# Patient Record
Sex: Male | Born: 1960 | Hispanic: No | Smoking: Current every day smoker
Health system: Southern US, Community
[De-identification: ages and names within clinical notes are randomized; demographics above are authoritative.]

## PROBLEM LIST (undated history)

## (undated) DIAGNOSIS — K219 Gastro-esophageal reflux disease without esophagitis: Secondary | ICD-10-CM

---

## 2005-01-14 ENCOUNTER — Inpatient Hospital Stay (HOSPITAL_COMMUNITY): Admission: AD | Admit: 2005-01-14 | Discharge: 2005-01-20 | Payer: Self-pay | Admitting: Psychiatry

## 2005-01-15 ENCOUNTER — Ambulatory Visit: Payer: Self-pay | Admitting: Psychiatry

## 2007-03-06 ENCOUNTER — Ambulatory Visit: Payer: Self-pay | Admitting: Psychiatry

## 2007-03-06 ENCOUNTER — Emergency Department (HOSPITAL_COMMUNITY): Admission: EM | Admit: 2007-03-06 | Discharge: 2007-03-06 | Payer: Self-pay | Admitting: Emergency Medicine

## 2007-03-07 ENCOUNTER — Inpatient Hospital Stay (HOSPITAL_COMMUNITY): Admission: RE | Admit: 2007-03-07 | Discharge: 2007-03-10 | Payer: Self-pay | Admitting: Psychiatry

## 2010-06-14 NOTE — H&P (Signed)
NAME:  COUSINSSasan, Wilkie NO.:  [id number]   MEDICAL RECORD NO.:  [id number]          PATIENT TYPE:  IPS   LOCATION:  0503                          FACILITY:  [facility]   PHYSICIAN:  Geoffery Lyons, M.D.      DATE OF BIRTH:  07-01-1960   DATE OF ADMISSION:  03/06/2007  DATE OF DISCHARGE:  03/06/2007                       PSYCHIATRIC ADMISSION ASSESSMENT   DATE OF ASSESSMENT:  March 07, 2007 and 8:20 a.m.   IDENTIFICATION:  This is a 50 year old African American male.  This is a  voluntary admission.   HISTORY OF PRESENT ILLNESS:  This 50 year old presented in our emergency  room requesting detox from alcohol.  He reports that he has been  drinking alcohol since childhood and currently is drinking a lot.  His  last long period of abstinence had been in 2006 after he was detoxed  here at [facility] and was able to maintain abstinence for 4-5  months.  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  The job shut down in December and the patient has  been drinking alcohol in escalating fashion since that time, now reports  drinking a lot every single day, pretty much as much as he can get his  hands on, becoming depressed and developing a bad attitude.  He  contacted his ex-wife, who is his main support person and with whom he  is a good friend, who brought him to the hospital.  He has been denying  any active suicidal thoughts.  He also endorses using large amounts of  marijuana and cocaine most every day and had used cocaine, marijuana and  alcohol on the day of admission.   PAST PSYCHIATRIC HISTORY:  He has a history of 1 prior admission to [facility],  December 16 to the January 20, 2005 and was discharged at that time on  Symmetrel 100 mg twice a day and Risperdal 0.5 mg every 12 hours and  Celexa 20 mg daily for depression.  He denies a history of prior suicide  attempts.  He had been using multiple substances since his  childhood.  He began drinking around age 14th; the longest abstinent period is 4-5  months.   SOCIAL HISTORY:  Basic education.  He is divorced and his ex-wife his  primary support person.  He has 1 daughter who is age 61, from whom he  is estranged, and he is currently homeless.  He has a history of 1 prior  DWI in 1992 and no current legal problems.   FAMILY HISTORY:  Family history is unknown; he grew up in foster care.   MEDICAL HISTORY:  No regular primary care Aldric Wenzler.   MEDICAL PROBLEMS:  Acid reflux.   MEDICATIONS:  Nexium 40 mg daily.   DRUG ALLERGIES:  No known drug allergies.   PAST MEDICAL HISTORY:  Remarkable for no prior history of seizure, no  known history of liver disease.   PHYSICAL EXAMINATION:  GENERAL:  Physical exam was done in the emergency  room.  This is a healthy-appearing gentleman who is in no acute  distress.  VITAL SIGNS:  Six-feet tall, 183 pounds.  Temperature 97.1, pulse 67,  respirations 18, blood pressure 119/64.   DIAGNOSTIC STUDIES:  Done in the emergency room.  He presented with an  alcohol level of 77.  Urine drug screen positive for cocaine and  marijuana.  CBC:  WBC 7.2, hemoglobin 13.2, hematocrit 38.8 and  platelets 277,000.  Chemistry:  Sodium 135, potassium 3.6, chloride 103,  carbon dioxide 27, BUN 12, creatinine 1.14 and random glucose 104.  Liver enzymes within normal limits, SGOT 16, SGPT 15, alkaline  phosphatase 58, total bilirubin is 0.8.  Calcium normal at 8.7.  Routine  urinalysis is negative for any acute findings.   MENTAL STATUS EXAM:  Fully alert gentleman, pleasant, cooperative, good  hygiene, bright affect, appears calm, no acute signs of withdrawal.  Speech normal in pace, tone and production and form.  Mood is neutral.  He reports that he had become quite depressed between the drinking and  not having any work to do feels; he feels that that was his main  trigger, with the lack of work and lack of direction in  his life, was  isolated in [place] with no support system and his ex-wife was  instrumental in getting him back to [place], able to identify his  relapse trigger his depression and lack of work.  Thought process is  logical and coherent.  He is oriented x4.  No delusional statements.  No  sign of delirium or confusion.  Insight is adequate.  He is formulated  goals for myself and would like to pursue [facility].   DIAGNOSES:   AXES I:  1. EtOH abuse and dependence.  2. Polysubstance abuse.   AXIS II:  Deferred.   AXIS III:  Acid reflux.   AXIS IV:  Homeless.   AXIS V:  Forty, past year not known.   PLAN:  Plan is to voluntarily admit the patient with a goal of a safe  detox within 5 days; we have started him on a Librium detox protocol and  he will receive Librium in tapering fashion along with vitamins,  thiamine 100 mg daily, MVI 1 daily and additional medications for  withdrawal symptoms.  He had previously been taking Nexium 40 mg daily,  which we will restart, and he had been prescribed Vistaril for nerves,  which we will discontinue at this point; he was not taking this anyway.  Meanwhile, we are going to get a TB skin test in anticipation of his  placement  with an [facility] and he is refusing influenza immunizations at this  time.  He is enrolled in dual diagnosis programming here and is actively  attending all groups.   ESTIMATED LENGTH OF STAY:  Five days.      Margaret A. Scott, N.P.      Geoffery Lyons, M.D.  Electronically Signed    MAS/MEDQ  D:  03/07/2007  T:  03/08/2007  Job:  [id number]

## 2010-06-17 NOTE — H&P (Signed)
NAME:  Danny Carey, Tsosie NO.:  [id number]   MEDICAL RECORD NO.:  [id number]          PATIENT TYPE:  IPS   LOCATION:  0303                          FACILITY:  [facility]   PHYSICIAN:  Geoffery Lyons, M.D.      DATE OF BIRTH:  10/18/1960   DATE OF ADMISSION:  01/14/2005  DATE OF DISCHARGE:                         PSYCHIATRIC ADMISSION ASSESSMENT   IDENTIFYING INFORMATION:  This is an involuntary admission.  This is a 50-  year-old separated African-American male.  He presented to the emergency  department at [facility] yesterday and requested help for his substance  abuse.  He says I know I have a problem and need help.  He states that  since being separated from his wife in June he has just been here and there,  especially jail.  Apparently his wife took out a 50B against him and he also  has felony charges for kidnapping and robbery going back to 1999.   PAST PSYCHIATRIC HISTORY:  He has none.   SOCIAL HISTORY:  He states he graduated high school in 86.  He has had all  kinds of jobs, textile, Aeronautical engineer, labor, Catering manager.  He has only been married  the one time to this wife, he states for 16 years.  They have no children  from the marriage.  He has other children that are grown that he is not in  contact with.   FAMILY HISTORY:  He denies.   ALCOHOL AND DRUG ABUSE:  He began using alcohol and marijuana at age 12.  He  began crack in his 30s.  He also smokes cigarettes.   PAST MEDICAL HISTORY:  Primary care Jamariya Davidoff is Dr. Juventino Slovak.  He is  reported to have acid reflux and takes Nexium.   ALLERGIES:  No known drug allergies.   POSITIVE PHYSICAL FINDINGS:  PHYSICAL EXAMINATION:  His urine drug screen  was positive for marijuana and cocaine.  His UTI showed a small amount of  leukocyte esterase, and his potassium was low at 3.  Otherwise his physical  examination is as per [place] but essentially he is a thin African-  American male in no acute distress.  His vital  signs upon admission here to  the [facility]: He weighed 164 pounds.  His temperature is  97.7, blood pressure is 120/71, pulse is 60 and respirations are 20.   MENTAL STATUS EXAM:  He is alert and oriented x3.  He is casually groomed  and dressed.  His eye contact is good.  His speech is slightly intense.  His  level of consciousness is fully alert.  His thought processes are relevant.  His judgment and insight are fair.  His concentration and memory are intact.  He denies suicidal or homicidal ideation.  He says that when he is using  drugs he has hallucinations but he cannot explain it.   ADMISSION DIAGNOSES:  AXIS I:  Polysubstance dependence.  AXIS II:  Rule out personality disorder not otherwise specified.  AXIS III:  Personal history for acid reflux treated with Nexium.  AXIS IV:  Severe, problems with primary support group,  occupational  problems, housing problems, economic problems, and problems related to legal  system.  There is a 50B out on him, taken out by his wife because he is  doing drugs and she is scared of him, and there is also a felony kidnapping  and robbery charge on the books since 1999.   PLAN:  The plan is to admit for safety and stabilization.  Toward that end  the low-dose Librium protocol was started, although he had no evidence for  alcohol when he was in the emergency room yesterday.  Due to his UTI we will  start Cipro 250 mg p.o. b.i.d. x3.  We will check him out for STDs as he has  been sexually active, and we will give him Protonix 20 mg daily for his  reflux, and we will give him K-Dur 20 mEq every morning for his low  potassium.  The case management will be working on placement for drug rehab  tomorrow.      Mickie Leonarda Salon, P.A.-C.      Geoffery Lyons, M.D.  Electronically Signed    MD/MEDQ  D:  01/15/2005  T:  01/15/2005  Job:  [id number]

## 2010-06-17 NOTE — Discharge Summary (Signed)
NAME:  Danny Carey, Danny Carey NO.:  [id number]   MEDICAL RECORD NO.:  [id number]          PATIENT TYPE:  IPS   LOCATION:  0503                          FACILITY:  [facility]   PHYSICIAN:  Geoffery Lyons, M.D.      DATE OF BIRTH:  1960-03-30   DATE OF ADMISSION:  03/07/2007  DATE OF DISCHARGE:  03/10/2007                               DISCHARGE SUMMARY   CHIEF COMPLAINT AND HISTORY OF PRESENT ILLNESS:  This was the second  admission to [facility] for this 50 year old African-  American male voluntarily admitted.  He requested detoxification from  alcohol.  Had been drinking alcohol since childhood. Last long period of  abstinence had been in 2006 after he was detoxified at [facility] was able to abstain for 4-5 months. More recently living in  [place]. He was working [employer] representative. His job shut down in  December, and he has been drinking alcohol in escalating fashion since  that time, drinking a lot every day, as much as he can get his hands on,  becoming depressed, developing a bad attitude.  Contacted his ex-wife,  his main support, and she brought him to the hospital. Also endorsed  using large amounts of marijuana, cocaine.   PAST PSYCHIATRIC HISTORY:  History of one prior admission to [facility] December 16 to January 20, 2005, secondary history had been  using multiple substances since childhood, drinking around age 26.  Longest abstinence 4-5 months.   MEDICAL HISTORY:  Noncontributory.   MEDICATIONS:  Nexium 40 mg per day.   PHYSICAL EXAMINATION:  Failed to show any acute findings.   LABORATORY WORKUP:  Alcohol level 77.  Urine drug screen positive for  cocaine and marijuana. White blood cells 7.2, hemoglobin 13.2 and  hematocrit 38.8. Sodium 135, potassium 3.6, BUN 12, creatinine 1.14,  glucose 104, SGOT 16, SGPT 15.   MENTAL STATUS EXAMINATION:  Reveals a fully alert, cooperative male,  good hygiene, bright affect, appears  to be calm, no acute signs of  withdrawal.  Speech was normal in pace, tone, and production. Had become  quite depressed between the drinking and not having any work to do. Larey Seat  that this was the main trigger, lack of work, lack of direction in his  life.  He was isolated in [place], no support. Thought processes  were logical, coherent and relevant.  No delusions.  No active suicidal  ideas, no hallucinations. Cognition well preserved.   AXIS I:  1. Alcohol dependence.  2. Cocaine and marijuana dependence.  3. Depressive disorder not otherwise specified.  AXIS II:  No diagnosis.  AXIS III:  Acid reflux.  AXIS IV:  Moderate.  AXIS V:  Upon admission 35, GAF in the last year 60.   COURSE IN THE HOSPITAL:  Was admitted, started individual and group  psychotherapy.  He was detoxified with Librium. He was given Nexium. As  already stated, a 51 year old male who was last admitted to [facility] December 2006. After he left, he went to the [facility].  He was able to abstain  for several months while he was in this  setting. He left the [facility] and got with the wrong people and  relapsed. Admitted to alcohol, cannot count how many drinks every day;  cocaine he snorts or chews every day, as well as marijuana. Admitted to  alcohol every day, cocaine, marijuana. 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  Endorsed increased  depression, increased use of substances.  In 2006, was discharged on  Symmetrel, Risperdal, trazodone and Celexa. More recently he was wanting  help.  He saw himself in the mirror and realized that he had to do  something. He continued to be detoxified. He was wanting to find an  [facility] as this place worked for him before as he knew he needed  the structure.  For gastroesophageal reflux, Nexium was the only  medication that will help him, so he was switched to Nexium.  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   He continued to work with detoxification and relapse prevention. He was  somewhat irritable.  On February 7, he endorsed homicidal ideas towards  anyone who might aggravate him all talk or talk about his family. Some  symptoms of withdrawal.  On February 8, he was observed standing outside  women's room doors, accused by male patient of making inappropriate  comments seeking phone numbers and groping them.  The patient denied. He  needed constant redirection, and he needed to be reminded of the  boundaries.  February 8, he was ready to be discharged. He endorsed that  he had a bad night that night, but he was ready to go on to halfway  house. He was insightful in that being off drugs and alcohol was causing  some of the anger he had. As he was stable enough with no suicidal or  homicidal ideas, we went ahead and discharged to where he was going to  go, to the [facility].   DISCHARGE DIAGNOSES:  AXIS I:  1. Alcohol, cocaine, marijuana dependence.  2. Mood disorder not otherwise specified.  AXIS II:  No diagnosis.  AXIS III:  Gastroesophageal reflux.  AXIS IV:  Moderate.  AXIS V:  Upon discharge 50.   Discharged on no psychotropics, to resume his own Nexium 40 mg per day.   Follow up at [facility].      Geoffery Lyons, M.D.  Electronically Signed     IL/MEDQ  D:  04/05/2007  T:  04/07/2007  Job:  [id number]

## 2010-06-17 NOTE — Discharge Summary (Signed)
NAME:  Danny Carey, Danny Carey NO.:  [id number]   MEDICAL RECORD NO.:  [id number]          PATIENT TYPE:  IPS   LOCATION:  0303                          FACILITY:  [facility]   PHYSICIAN:  Jeanice Lim, M.D. DATE OF BIRTH:  28-Jan-1961   DATE OF ADMISSION:  01/14/2005  DATE OF DISCHARGE:  01/20/2005                                 DISCHARGE SUMMARY   IDENTIFYING DATA:  This is a 50 year old African-American male who presented  to the emergency room at [facility], requested help with his substance abuse  problem.  Felt that he was getting out of control and unable to go on like  this.   PAST PSYCHIATRIC HISTORY:  No prior treatment.  History of alcohol and  cannabis use.   PAST MEDICAL HISTORY:  Acid reflux.   MEDICATIONS:  Nexium.   ALLERGIES:  No known drug allergies.   PHYSICAL EXAMINATION:  Neurologic exam essentially within normal limits.   ROUTINE ADMISSION LABORATORY:  Salicylate within normal limits, low  potassium and possible leukocyte esterase.  TSH within normal limits.  RPR  nonreactive.  HIV nonreactive.  Valproic acid level less than 1.  Chlamydia  none, GC probe negative.   MENTAL STATUS EXAM:  The patient was alert and oriented.  No psychomotor  abnormalities.  Tremulous, anxious, motivated to get sober, dysphoric and  irritable.  No acute homicidal or suicidal ideation or psychotic symptoms.  Cognition was intact.  Judgment and insight were impaired.   ADMITTING DIAGNOSES:  Axis I.  Polysubstance abuse, alcohol and marijuana  dependency.  Axis II.  Personality features.  Axis III.  Acid reflux history.  Axis IV.  Severe stressors with limited support system.  Problems related to  legal system.  Axis V.  37/55 to 60   HOSPITAL COURSE:  The patient was admitted, ordered routine p.r.n.  medications, underwent further monitoring and was encouraged to participate  in individual and group milieu therapy.  The patient was monitored for  safety and  discussed longer term monitoring treatment program.  Appeared  motivated and reported some depressive symptoms and reported that he could  not leave the hospital because he felt he might step into traffic or  overdose unless he knew he was going to be able to get his life together and  he was fearful of the severity of his addiction which showed some insight.  The patient was stable after detox and was discharged in improved condition  with an appropriate aftercare plan in place.  He was given medication  education and discharged on Symmetrel 100 mg twice a day, Protonix 40 mg in  the morning, Risperdal 0.5 mg q.12, Trazodone 50 mg 1-1/2 at bedtime, Celexa  20 mg in the morning.  The patient was discharged to follow up at the [facility].   DISCHARGE DIAGNOSES:  Axis I.  Polysubstance abuse, alcohol and marijuana  dependency.  Axis II.  Personality features.  Axis III.  Acid reflux history.  Axis IV.  Severe stressors with limited support system.  Problems related to  legal system.  Axis V.  55  Jeanice Lim, M.D.  Electronically Signed     JEM/MEDQ  D:  01/26/2005  T:  01/26/2005  Job:  [id number]

## 2010-10-21 LAB — URINALYSIS, ROUTINE W REFLEX MICROSCOPIC
Bilirubin Urine: NEGATIVE
Glucose, UA: NEGATIVE
Hgb urine dipstick: NEGATIVE
Ketones, ur: NEGATIVE
Nitrite: NEGATIVE
Specific Gravity, Urine: 1.015
pH: 5.5

## 2010-10-21 LAB — DRUGS OF ABUSE SCREEN W/O ALC, ROUTINE URINE
Barbiturate Quant, Ur: NEGATIVE
Benzodiazepines.: POSITIVE — AB
Cocaine Metabolites: POSITIVE — AB
Marijuana Metabolite: POSITIVE — AB
Propoxyphene: NEGATIVE

## 2010-10-21 LAB — DIFFERENTIAL
Basophils Relative: 2 — ABNORMAL HIGH
Eosinophils Absolute: 0.3
Lymphs Abs: 2.5
Monocytes Relative: 6
Neutro Abs: 5.1
Neutrophils Relative %: 60

## 2010-10-21 LAB — CBC
HCT: 38.8 — ABNORMAL LOW
Hemoglobin: 13.2
MCHC: 34.4
Platelets: 293
RBC: 3.95 — ABNORMAL LOW
RBC: 3.95 — ABNORMAL LOW
WBC: 7.2
WBC: 8.5

## 2010-10-21 LAB — BASIC METABOLIC PANEL
BUN: 10
Calcium: 9.1
Creatinine, Ser: 0.93
GFR calc Af Amer: >60

## 2010-10-21 LAB — COMPREHENSIVE METABOLIC PANEL
ALT: 15
Alkaline Phosphatase: 58
BUN: 12
CO2: 27
Chloride: 103
Glucose, Bld: 104 — ABNORMAL HIGH
Potassium: 3.6
Sodium: 135
Total Bilirubin: 0.8

## 2010-10-21 LAB — RAPID URINE DRUG SCREEN, HOSP PERFORMED
Barbiturates: NOT DETECTED
Cocaine: POSITIVE — AB
Opiates: NOT DETECTED
Tetrahydrocannabinol: POSITIVE — AB

## 2010-10-21 LAB — THC (MARIJUANA), URINE, CONFIRMATION: Marijuana, Ur-Confirmation: 32 ng/mL

## 2010-10-21 LAB — COCAINE, URINE, CONFIRMATION: Benzoylecgonine GC/MS Conf: 550 ng/mL

## 2010-10-21 LAB — ETHANOL: Alcohol, Ethyl (B): 77 — ABNORMAL HIGH

## 2010-10-21 LAB — BENZODIAZEPINE, QUANTITATIVE, URINE: Alprazolam (GC/LC/MS), ur confirm: NEGATIVE

## 2015-12-08 ENCOUNTER — Emergency Department (HOSPITAL_COMMUNITY)
Admission: EM | Admit: 2015-12-08 | Discharge: 2015-12-08 | Disposition: A | Discharge status: Home / Self Care | Payer: BLUE CROSS/BLUE SHIELD | Attending: Emergency Medicine | Admitting: Emergency Medicine

## 2015-12-08 ENCOUNTER — Emergency Department (HOSPITAL_COMMUNITY): Payer: BLUE CROSS/BLUE SHIELD

## 2015-12-08 ENCOUNTER — Encounter (HOSPITAL_COMMUNITY): Payer: Self-pay | Admitting: Emergency Medicine

## 2015-12-08 DIAGNOSIS — M549 Dorsalgia, unspecified: Secondary | ICD-10-CM | POA: Diagnosis not present

## 2015-12-08 DIAGNOSIS — S199XXA Unspecified injury of neck, initial encounter: Secondary | ICD-10-CM | POA: Insufficient documentation

## 2015-12-08 DIAGNOSIS — F17200 Nicotine dependence, unspecified, uncomplicated: Secondary | ICD-10-CM | POA: Diagnosis not present

## 2015-12-08 DIAGNOSIS — Y92410 Unspecified street and highway as the place of occurrence of the external cause: Secondary | ICD-10-CM | POA: Diagnosis not present

## 2015-12-08 DIAGNOSIS — Y999 Unspecified external cause status: Secondary | ICD-10-CM | POA: Insufficient documentation

## 2015-12-08 DIAGNOSIS — Y939 Activity, unspecified: Secondary | ICD-10-CM | POA: Diagnosis not present
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
## 2015-12-08 MED ORDER — ASPIRIN EC 81 MG PO TBEC
162.0000 mg | DELAYED_RELEASE_TABLET | Freq: Once | ORAL | Status: DC
  Filled 2015-12-08: qty 2

## 2015-12-08 MED ORDER — ACETAMINOPHEN 325 MG PO TABS
650.0000 mg | ORAL_TABLET | Freq: Once | ORAL | Status: AC
  Administered 2015-12-08: 650 mg via ORAL
  Filled 2015-12-08: qty 2
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
## 2015-12-08 NOTE — Discharge Instructions (Signed)
Please read attached information. If you experience any new or worsening signs or symptoms please return to the emergency room for evaluation. Please follow-up with your primary care provider or specialist as discussed.  °

## 2015-12-08 NOTE — ED Notes (Signed)
Did not scan tylenol as ordered and pt did not sign due to pt being in a rush to leave.

## 2015-12-09 ENCOUNTER — Emergency Department (HOSPITAL_COMMUNITY)
Admission: EM | Admit: 2015-12-09 | Discharge: 2015-12-09 | Disposition: A | Discharge status: Home / Self Care | Payer: BLUE CROSS/BLUE SHIELD | Attending: Emergency Medicine | Admitting: Emergency Medicine

## 2015-12-09 ENCOUNTER — Encounter (HOSPITAL_COMMUNITY): Payer: Self-pay | Admitting: Emergency Medicine

## 2015-12-09 DIAGNOSIS — F17200 Nicotine dependence, unspecified, uncomplicated: Secondary | ICD-10-CM | POA: Diagnosis not present

## 2015-12-09 DIAGNOSIS — M542 Cervicalgia: Secondary | ICD-10-CM | POA: Diagnosis present

## 2015-12-09 DIAGNOSIS — R51 Headache: Secondary | ICD-10-CM | POA: Diagnosis not present

## 2015-12-09 DIAGNOSIS — M549 Dorsalgia, unspecified: Secondary | ICD-10-CM | POA: Insufficient documentation

## 2015-12-09 MED ORDER — ACETAMINOPHEN 325 MG PO TABS: 650.0000 mg | ORAL_TABLET | Freq: Four times a day (QID) | ORAL | 0 refills | Status: DC | PRN

## 2015-12-09 MED ORDER — METHOCARBAMOL 500 MG PO TABS: 500.0000 mg | ORAL_TABLET | Freq: Two times a day (BID) | ORAL | 0 refills | Status: DC

## 2015-12-09 MED ORDER — IBUPROFEN 400 MG PO TABS: 400.0000 mg | ORAL_TABLET | Freq: Four times a day (QID) | ORAL | 0 refills | Status: DC | PRN

## 2015-12-09 NOTE — Discharge Instructions (Signed)
You likely will be sore for the next 2-3 days. Please take the Robaxin before bed or while relaxing at home (do not drive or operate any heavy machinery while taking it) for relief of your muscle tightness. Also, take ibuprofen and Tylenol every 6 hours as needed for pain.
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
## 2015-12-09 NOTE — ED Notes (Signed)
Bed: [facility] Expected date:  Expected time:  Means of arrival:  Comments: 

## 2015-12-12 ENCOUNTER — Encounter (HOSPITAL_COMMUNITY): Payer: Self-pay | Admitting: Emergency Medicine

## 2015-12-12 ENCOUNTER — Emergency Department (HOSPITAL_COMMUNITY)
Admission: EM | Admit: 2015-12-12 | Discharge: 2015-12-12 | Disposition: A | Discharge status: Home / Self Care | Payer: BLUE CROSS/BLUE SHIELD | Attending: Emergency Medicine | Admitting: Emergency Medicine

## 2015-12-12 DIAGNOSIS — S39012D Strain of muscle, fascia and tendon of lower back, subsequent encounter: Secondary | ICD-10-CM | POA: Insufficient documentation

## 2015-12-12 DIAGNOSIS — S39012A Strain of muscle, fascia and tendon of lower back, initial encounter: Secondary | ICD-10-CM

## 2015-12-12 DIAGNOSIS — F17210 Nicotine dependence, cigarettes, uncomplicated: Secondary | ICD-10-CM | POA: Diagnosis not present

## 2015-12-12 HISTORY — DX: Gastro-esophageal reflux disease without esophagitis: K21.9

## 2015-12-12 MED ORDER — NAPROXEN 500 MG PO TABS: 500.0000 mg | ORAL_TABLET | Freq: Two times a day (BID) | ORAL | 0 refills | Status: DC

## 2015-12-12 NOTE — Discharge Instructions (Signed)
Stop taking ibuprofen. Start taking Naprosyn twice daily. Continue taking Robaxin. You may take 1000 mg of Tylenol every 6 hours as well. Go to your scheduled orthopedic appointment tomorrow. Return to the emergency department for numbness, weakness, urinary retention, inability to walk, or any new or concerning symptoms.
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
## 2015-12-12 NOTE — ED Notes (Signed)
Bed: [facility] Expected date: 12/11/15 Expected time: 11:41 PM Means of arrival:  Comments:

## 2015-12-12 NOTE — ED Notes (Signed)
PA at bedside.

## 2015-12-31 ENCOUNTER — Other Ambulatory Visit (HOSPITAL_COMMUNITY): Payer: Self-pay | Admitting: Orthopedic Surgery

## 2015-12-31 DIAGNOSIS — S335XXD Sprain of ligaments of lumbar spine, subsequent encounter: Secondary | ICD-10-CM

## 2016-01-05 ENCOUNTER — Ambulatory Visit (HOSPITAL_COMMUNITY): Admission: RE | Admit: 2016-01-05 | Payer: No Typology Code available for payment source | Source: Ambulatory Visit

## 2017-04-25 ENCOUNTER — Ambulatory Visit (HOSPITAL_COMMUNITY)
Admission: EM | Admit: 2017-04-25 | Discharge: 2017-04-25 | Disposition: A | Discharge status: Home / Self Care | Payer: BLUE CROSS/BLUE SHIELD | Attending: Family Medicine | Admitting: Family Medicine

## 2017-04-25 ENCOUNTER — Encounter (HOSPITAL_COMMUNITY): Payer: Self-pay | Admitting: Emergency Medicine

## 2017-04-25 DIAGNOSIS — J0100 Acute maxillary sinusitis, unspecified: Secondary | ICD-10-CM | POA: Diagnosis not present

## 2017-04-25 MED ORDER — AMOXICILLIN-POT CLAVULANATE 875-125 MG PO TABS: 1.0000 | ORAL_TABLET | Freq: Two times a day (BID) | ORAL | 0 refills | Status: DC

## 2017-04-25 NOTE — ED Provider Notes (Signed)
[facility]   [id number] 04/25/17 Arrival Time: 1047  ASSESSMENT & PLAN:  1. Acute non-recurrent maxillary sinusitis     Meds ordered this encounter  Medications  . amoxicillin-clavulanate (AUGMENTIN) 875-125 MG tablet    Sig: Take 1 tablet by mouth every 12 (twelve) hours.    Dispense:  20 tablet    Refill:  0   Discussed typical duration of symptoms. OTC symptom care as needed. Ensure adequate fluid intake and rest. May f/u with PCP or here as needed.  Reviewed expectations re: course of current medical issues. Questions answered. Outlined signs and symptoms indicating need for more acute intervention. Patient verbalized understanding. After Visit Summary given.   SUBJECTIVE: History from: patient.  Danny Gallonthony Heinsohn is a 57 y.o. male who presents with complaint of nasal congestion, post-nasal drainage, and sinus pain. Onset gradual, approximately 1 week ago. Respiratory symptoms: none Fever: no. Overall normal PO intake without n/v. OTC treatment: none. History of frequent sinus infections: occasional.  Social History   Tobacco Use  Smoking Status Current Every Day Smoker  . Packs/day: 0.50  . Types: Cigarettes  Smokeless Tobacco Never Used    ROS: As per HPI.   OBJECTIVE:  Vitals:   04/25/17 1147  BP: 122/72  Pulse: 86  Resp: 20  Temp: (!) 97.5 F (36.4 C)  TempSrc: Oral  SpO2: 100%     General appearance: alert; appears fatigued HEENT: nasal congestion; clear runny nose; throat irritation secondary to post-nasal drainage; bilateral maxillary tenderness to palpation; turbinates boggy Neck: supple without LAD Lungs: unlabored respirations, symmetrical air entry; cough: absent; no respiratory distress Skin: warm and dry Psychological: alert and cooperative; normal mood and affect  Allergies  Allergen Reactions  . Fish Allergy Anaphylaxis  . Shellfish Allergy Anaphylaxis    Past Medical History:  Diagnosis Date  . GERD  (gastroesophageal reflux disease)    History reviewed. No pertinent family history. Social History   Socioeconomic History  . Marital status: Divorced    Spouse name: Not on file  . Number of children: Not on file  . Years of education: Not on file  . Highest education level: Not on file  Occupational History  . Not on file  Social Needs  . Financial resource strain: Not on file  . Food insecurity:    Worry: Not on file    Inability: Not on file  . Transportation needs:    Medical: Not on file    Non-medical: Not on file  Tobacco Use  . Smoking status: Current Every Day Smoker    Packs/day: 0.50    Types: Cigarettes  . Smokeless tobacco: Never Used  Substance and Sexual Activity  . Alcohol use: Yes    Comment: occ  . Drug use: No  . Sexual activity: Not on file  Lifestyle  . Physical activity:    Days per week: Not on file    Minutes per session: Not on file  . Stress: Not on file  Relationships  . Social connections:    Talks on phone: Not on file    Gets together: Not on file    Attends religious service: Not on file    Active member of club or organization: Not on file    Attends meetings of clubs or organizations: Not on file    Relationship status: Not on file  . Intimate partner violence:    Fear of current or ex partner: Not on file    Emotionally abused: Not on file  Physically abused: Not on file    Forced sexual activity: Not on file  Other Topics Concern  . Not on file  Social History Narrative  . Not on file       Danny Layman, MD 04/28/17 1139

## 2017-04-25 NOTE — ED Triage Notes (Signed)
Pt sts URI sx x 5 days with congestion

## 2017-08-02 ENCOUNTER — Other Ambulatory Visit: Payer: Self-pay

## 2017-08-02 ENCOUNTER — Emergency Department (HOSPITAL_COMMUNITY): Payer: BLUE CROSS/BLUE SHIELD

## 2017-08-02 ENCOUNTER — Encounter (HOSPITAL_COMMUNITY): Payer: Self-pay | Admitting: Emergency Medicine

## 2017-08-02 ENCOUNTER — Emergency Department (HOSPITAL_COMMUNITY)
Admission: EM | Admit: 2017-08-02 | Discharge: 2017-08-02 | Disposition: A | Discharge status: Home / Self Care | Payer: BLUE CROSS/BLUE SHIELD | Attending: Emergency Medicine | Admitting: Emergency Medicine

## 2017-08-02 DIAGNOSIS — J019 Acute sinusitis, unspecified: Secondary | ICD-10-CM | POA: Diagnosis not present

## 2017-08-02 DIAGNOSIS — F17210 Nicotine dependence, cigarettes, uncomplicated: Secondary | ICD-10-CM | POA: Diagnosis not present

## 2017-08-02 DIAGNOSIS — R509 Fever, unspecified: Secondary | ICD-10-CM | POA: Diagnosis present

## 2017-08-02 LAB — COMPREHENSIVE METABOLIC PANEL
ALBUMIN: 4.4 g/dL (ref 3.5–5.0)
ALT: 27 U/L (ref 0–44)
AST: 32 U/L (ref 15–41)
Alkaline Phosphatase: 67 U/L (ref 38–126)
Anion gap: 12 (ref 5–15)
BUN: 12 mg/dL (ref 6–20)
CHLORIDE: 99 mmol/L (ref 98–111)
CO2: 26 mmol/L (ref 22–32)
Calcium: 9.1 mg/dL (ref 8.9–10.3)
Creatinine, Ser: 1.14 mg/dL (ref 0.61–1.24)
GFR calc Af Amer: >60 mL/min (ref >60)
GLUCOSE: 107 mg/dL — AB (ref 70–99)
POTASSIUM: 4.1 mmol/L (ref 3.5–5.1)
SODIUM: 137 mmol/L (ref 135–145)
Total Bilirubin: 1.2 mg/dL (ref 0.3–1.2)
Total Protein: 8 g/dL (ref 6.5–8.1)

## 2017-08-02 LAB — CBC WITH DIFFERENTIAL/PLATELET
Basophils Absolute: 0 10*3/uL (ref 0.0–0.1)
Basophils Relative: 0 %
EOS PCT: 0 %
Eosinophils Absolute: 0 10*3/uL (ref 0.0–0.7)
HEMATOCRIT: 46.7 % (ref 39.0–52.0)
Hemoglobin: 15.7 g/dL (ref 13.0–17.0)
LYMPHS PCT: 15 %
Lymphs Abs: 1.1 10*3/uL (ref 0.7–4.0)
MCH: 33.9 pg (ref 26.0–34.0)
MCHC: 33.6 g/dL (ref 30.0–36.0)
MCV: 100.9 fL — ABNORMAL HIGH (ref 78.0–100.0)
MONO ABS: 1 10*3/uL (ref 0.1–1.0)
MONOS PCT: 13 %
NEUTROS ABS: 5.2 10*3/uL (ref 1.7–7.7)
Neutrophils Relative %: 72 %
PLATELETS: 269 10*3/uL (ref 150–400)
RBC: 4.63 MIL/uL (ref 4.22–5.81)
RDW: 12.5 % (ref 11.5–15.5)
WBC: 7.3 10*3/uL (ref 4.0–10.5)

## 2017-08-02 LAB — I-STAT CG4 LACTIC ACID, ED: LACTIC ACID, VENOUS: 1.78 mmol/L (ref 0.5–1.9)

## 2017-08-02 IMAGING — CR DG CHEST 2V
2 series · 2 of 2 positions shown · non-contrast
Comparison: [DATE]

CLINICAL DATA: Productive cough for 2 days

EXAM:
CHEST - 2 VIEW

[w chest pa]
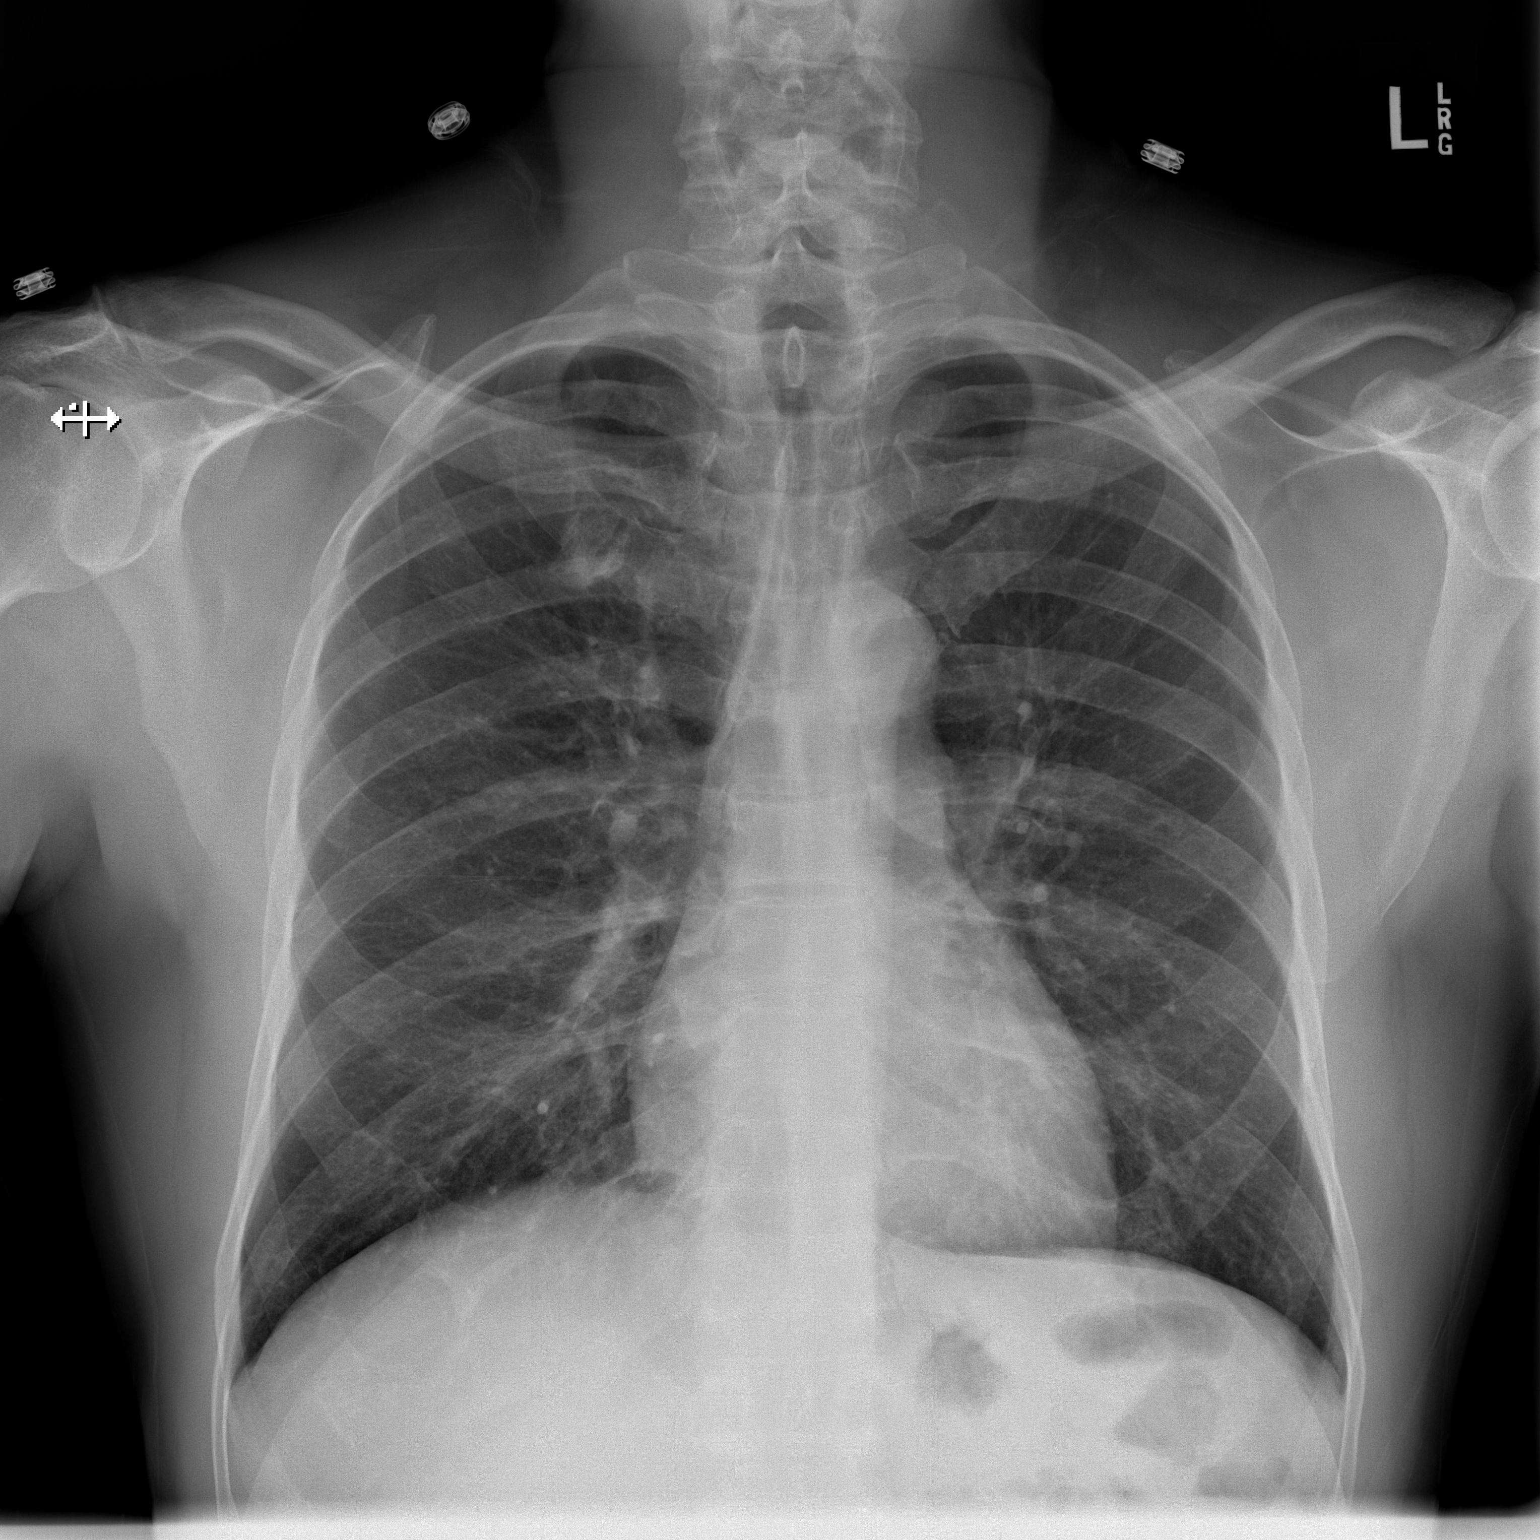

[w chest lat]
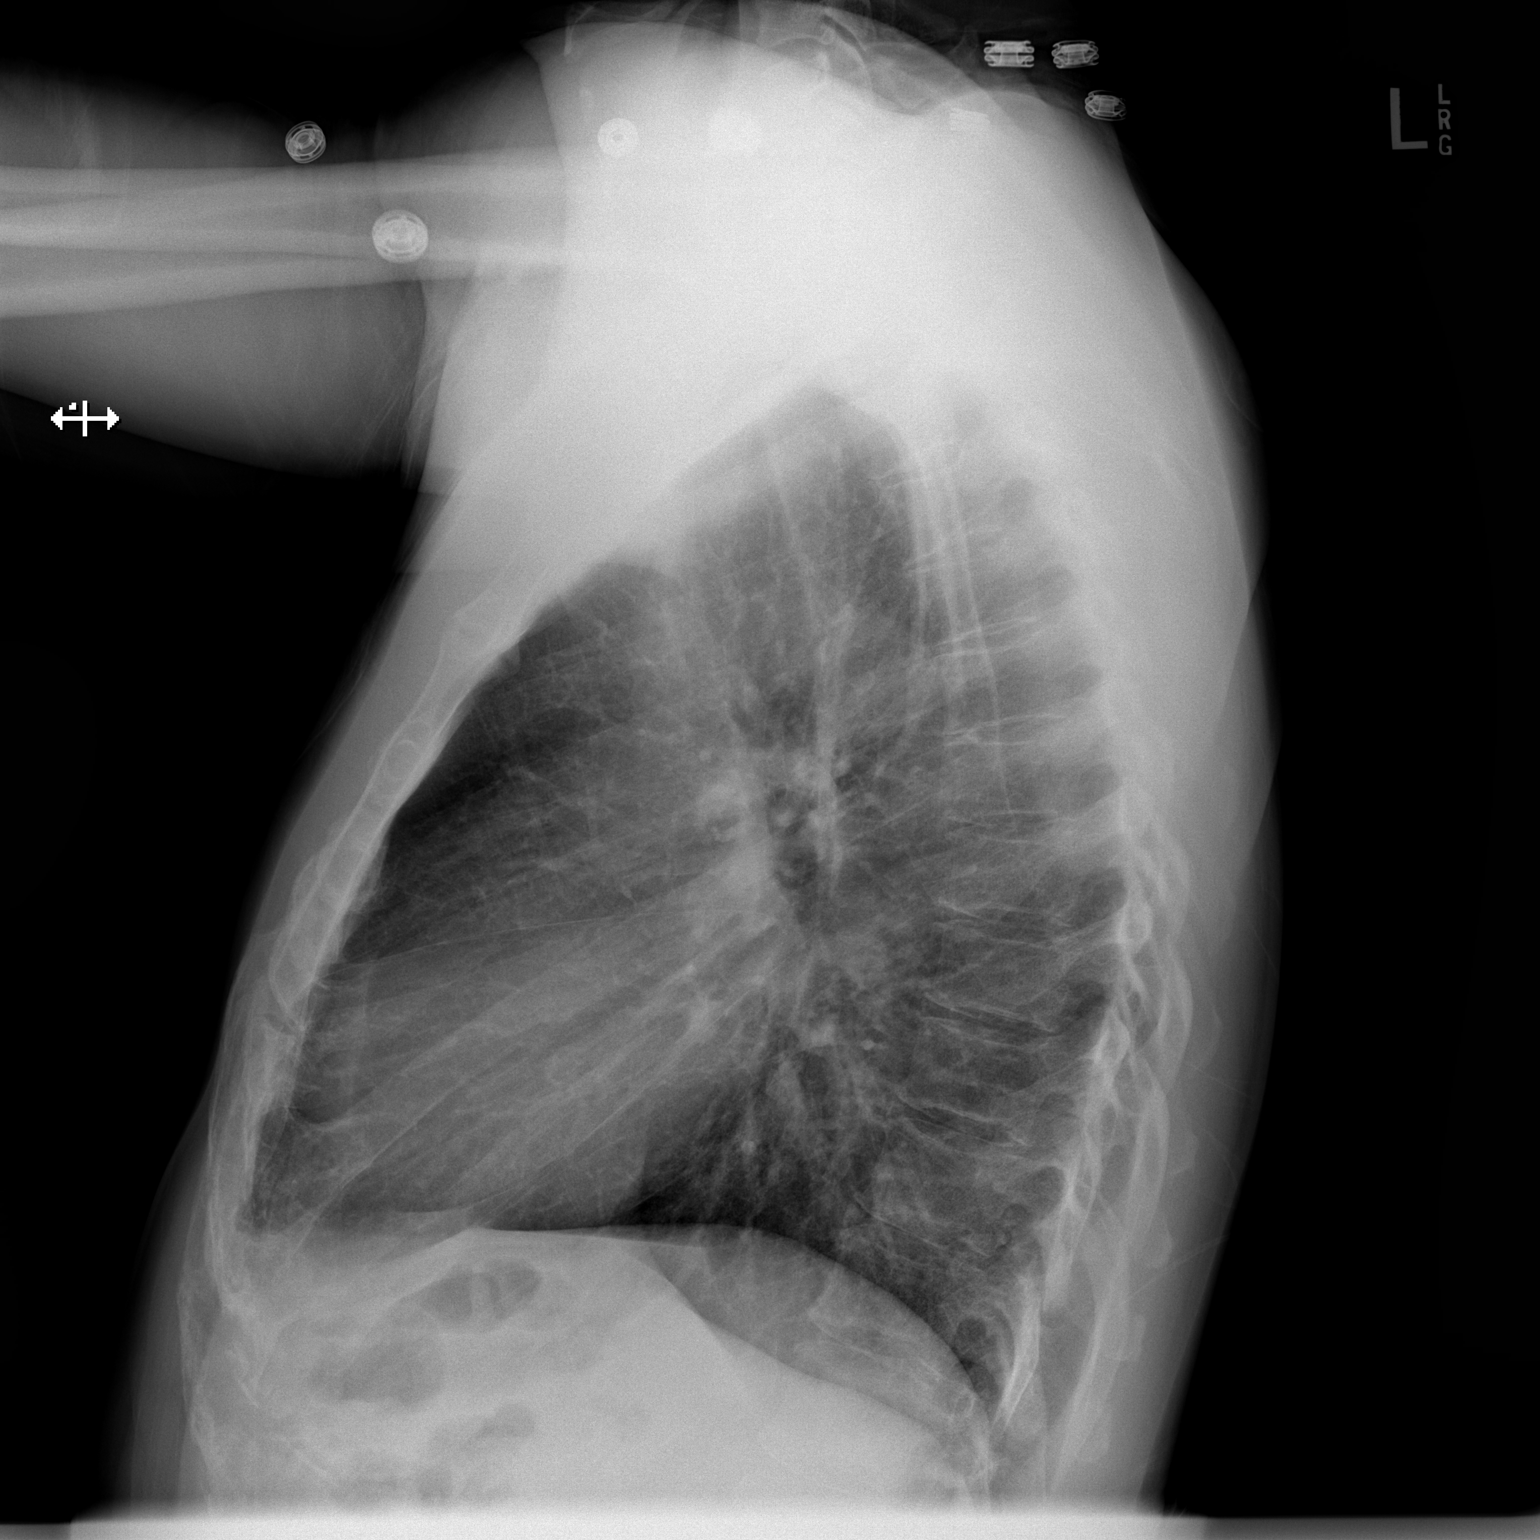

[2 of 2 positions shown; findings below may reference images not displayed]

FINDINGS: The heart size and mediastinal contours are within normal limits.
Both lungs are clear. The visualized skeletal structures are
unremarkable.
IMPRESSION: No active cardiopulmonary disease.

## 2017-08-02 MED ORDER — AMOXICILLIN 500 MG PO CAPS: 500.0000 mg | ORAL_CAPSULE | Freq: Three times a day (TID) | ORAL | 0 refills | Status: DC

## 2017-08-02 MED ORDER — ACETAMINOPHEN 325 MG PO TABS: 650.0000 mg | ORAL_TABLET | Freq: Once | ORAL | Status: DC | PRN

## 2017-08-02 NOTE — Discharge Instructions (Addendum)
Take Tylenol 650 mg every 4 hours as needed for fever, feeling cold or pain.  Use antibiotic prescription as prescribed to improve your discomfort.  See the doctor of your choice as needed for problems.

## 2017-08-02 NOTE — ED Triage Notes (Signed)
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

## 2017-08-02 NOTE — ED Provider Notes (Signed)
[facility]-EMERGENCY DEPT Provider Note   CSN: [id number] Arrival date & time: 08/02/17  1146     History   Chief Complaint Chief Complaint  Patient presents with  . Cough  . Fever    HPI Danny Carey is a 57 y.o. male.  HPI   He complains of fever, chills, cough, sneezing and nasal congestion for 3 days.  He is a smoker.  He denies weakness, dizziness, diarrhea.  He has had some nausea and vomiting and having difficulty eating and drinking.  He denies abdominal or back pain.  There are no other known modifying factors.  Past Medical History:  Diagnosis Date  . GERD (gastroesophageal reflux disease)     There are no active problems to display for this patient.   History reviewed. No pertinent surgical history.      Home Medications    Prior to Admission medications   Medication Sig Start Date End Date Taking? Authorizing Provider  amoxicillin-clavulanate (AUGMENTIN) 875-125 MG tablet Take 1 tablet by mouth every 12 (twelve) hours. 04/25/17  Yes Mardella Layman, MD  acetaminophen (TYLENOL) 325 MG tablet Take 2 tablets (650 mg total) by mouth every 6 (six) hours as needed. Patient taking differently: Take 650 mg by mouth every 6 (six) hours as needed for moderate pain.  12/09/15   de Villier, Daryl F II, PA  amoxicillin (AMOXIL) 500 MG capsule Take 1 capsule (500 mg total) by mouth 3 (three) times daily. 08/02/17   Mancel Bale, MD  methocarbamol (ROBAXIN) 500 MG tablet Take 1 tablet (500 mg total) by mouth 2 (two) times daily. Patient not taking: Reported on 08/02/2017 12/09/15   de Villier, Daryl F II, PA  naproxen (NAPROSYN) 500 MG tablet Take 1 tablet (500 mg total) by mouth 2 (two) times daily. Patient not taking: Reported on 08/02/2017 12/12/15   Cheri Fowler, PA-C    Family History No family history on file.  Social History Social History   Tobacco Use  . Smoking status: Current Every Day Smoker    Packs/day: 0.50    Types: Cigarettes  .  Smokeless tobacco: Never Used  Substance Use Topics  . Alcohol use: Yes    Comment: occ  . Drug use: No     Allergies   Fish allergy and Shellfish allergy   Review of Systems Review of Systems  All other systems reviewed and are negative.    Physical Exam Updated Vital Signs BP 128/85 (BP Location: Left Arm)   Pulse 82   Temp 100.2 F (37.9 C) (Oral)   Resp 18   SpO2 95%   Physical Exam  Constitutional: He is oriented to person, place, and time. He appears well-developed and well-nourished.  HENT:  Head: Normocephalic and atraumatic.  Right Ear: External ear normal.  Left Ear: External ear normal.  Clear nasal discharge is present bilaterally  Eyes: Pupils are equal, round, and reactive to light. Conjunctivae and EOM are normal.  Neck: Normal range of motion and phonation normal. Neck supple.  Cardiovascular: Normal rate, regular rhythm and normal heart sounds.  Pulmonary/Chest: Effort normal and breath sounds normal. No stridor. No respiratory distress. He exhibits no bony tenderness.  Abdominal: Soft. There is no tenderness.  Musculoskeletal: Normal range of motion.  Neurological: He is alert and oriented to person, place, and time. No cranial nerve deficit or sensory deficit. He exhibits normal muscle tone. Coordination normal.  Skin: Skin is warm, dry and intact.  Psychiatric: He has a normal mood  and affect. His behavior is normal. Judgment and thought content normal.  Nursing note and vitals reviewed.    ED Treatments / Results  Labs (all labs ordered are listed, but only abnormal results are displayed) Labs Reviewed  COMPREHENSIVE METABOLIC PANEL - Abnormal; Notable for the following components:      Result Value   Glucose, Bld 107 (*)    All other components within normal limits  CBC WITH DIFFERENTIAL/PLATELET - Abnormal; Notable for the following components:   MCV 100.9 (*)    All other components within normal limits  I-STAT CG4 LACTIC ACID, ED     EKG None  Radiology Dg Chest 2 View  Result Date: 08/02/2017 CLINICAL DATA:  Productive cough for 2 days EXAM: CHEST - 2 VIEW COMPARISON:  12/08/2015 FINDINGS: The heart size and mediastinal contours are within normal limits. Both lungs are clear. The visualized skeletal structures are unremarkable. IMPRESSION: No active cardiopulmonary disease. Electronically Signed   By: Alcide CleverMark  Lukens M.D.   On: 08/02/2017 12:26    Procedures Procedures (including critical care time)  Medications Ordered in ED Medications  acetaminophen (TYLENOL) tablet 650 mg (has no administration in time range)     Initial Impression / Assessment and Plan / ED Course  I have reviewed the triage vital signs and the nursing notes.  Pertinent labs & imaging results that were available during my care of the patient were reviewed by me and considered in my medical decision making (see chart for details).      Patient Vitals for the past 24 hrs:  BP Temp Temp src Pulse Resp SpO2  08/02/17 1518 128/85 - - 82 18 95 %  08/02/17 1159 109/79 100.2 F (37.9 C) Oral 92 20 100 %    3:26 PM Reevaluation with update and discussion. After initial assessment and treatment, an updated evaluation reveals he is comfortable, ambulatory, anxious to leave.  Findings discussed with him and all questions were answered. Mancel BaleElliott Gailen Venne   Medical Decision Making: Nonspecific sinusitis possibly viral, versus mild bacterial.  Doubt serious bacterial infection, metabolic instability or impending vascular collapse.  CRITICAL CARE-no Performed by: Mancel BaleElliott Jaylenne Hamelin   Nursing Notes Reviewed/ Care Coordinated Applicable Imaging Reviewed Interpretation of Laboratory Data incorporated into ED treatment  The patient appears reasonably screened and/or stabilized for discharge and I doubt any other medical condition or other [facility] requiring further screening, evaluation, or treatment in the ED at this time prior to discharge.  Plan: Home  Medications-OTC antipyretic/cough medicine; Home Treatments-rest, fluids; return here if the recommended treatment, does not improve the symptoms; Recommended follow up-PCP, as needed    Final Clinical Impressions(s) / ED Diagnoses   Final diagnoses:  Acute non-recurrent sinusitis, unspecified location    ED Discharge Orders        Ordered    amoxicillin (AMOXIL) 500 MG capsule  3 times daily     08/02/17 1522       Mancel BaleWentz, Dent Plantz, MD 08/02/17 1528

## 2017-08-02 NOTE — ED Notes (Signed)
Patient refused blood draw for second lactic

## 2017-08-02 NOTE — ED Notes (Signed)
Bed: WTR5 Expected date:  Expected time:  Means of arrival:  Comments: 

## 2017-08-02 NOTE — ED Notes (Signed)
[name] ok to cancel Lactic

## 2018-03-27 ENCOUNTER — Ambulatory Visit (HOSPITAL_COMMUNITY)
Admission: EM | Admit: 2018-03-27 | Discharge: 2018-03-27 | Disposition: A | Discharge status: Home / Self Care | Payer: BLUE CROSS/BLUE SHIELD | Attending: Family Medicine | Admitting: Family Medicine

## 2018-03-27 ENCOUNTER — Other Ambulatory Visit: Payer: Self-pay

## 2018-03-27 ENCOUNTER — Encounter (HOSPITAL_COMMUNITY): Payer: Self-pay | Admitting: Emergency Medicine

## 2018-03-27 DIAGNOSIS — Z202 Contact with and (suspected) exposure to infections with a predominantly sexual mode of transmission: Secondary | ICD-10-CM | POA: Diagnosis present

## 2018-03-27 DIAGNOSIS — F17210 Nicotine dependence, cigarettes, uncomplicated: Secondary | ICD-10-CM

## 2018-03-27 DIAGNOSIS — A5909 Other urogenital trichomoniasis: Secondary | ICD-10-CM

## 2018-03-27 MED ORDER — METRONIDAZOLE 500 MG PO TABS
ORAL_TABLET | ORAL | Status: AC
  Filled 2018-03-27: qty 4

## 2018-03-27 MED ORDER — METRONIDAZOLE 500 MG PO TABS
2000.0000 mg | ORAL_TABLET | Freq: Once | ORAL | Status: AC
  Administered 2018-03-27: 2000 mg via ORAL

## 2018-03-27 NOTE — Discharge Instructions (Signed)
We treated you for trichomonas today Sending urine for testing we will call you with any positive results Follow up as needed for continued or worsening symptoms

## 2018-03-27 NOTE — ED Provider Notes (Signed)
[facility]    CSN: [id number] Arrival date & time: 03/27/18  1340     History   Chief Complaint Chief Complaint  Patient presents with  . Exposure to STD    HPI Danny Carey is a 58 y.o. male.   Patient is a 58 year old male that presents today for STD screening.  Reports that his partner tested positive for trichomonas.  He is not having any current symptoms.     Past Medical History:  Diagnosis Date  . GERD (gastroesophageal reflux disease)     There are no active problems to display for this patient.   History reviewed. No pertinent surgical history.     Home Medications    Prior to Admission medications   Medication Sig Start Date End Date Taking? Authorizing Provider  acetaminophen (TYLENOL) 325 MG tablet Take 2 tablets (650 mg total) by mouth every 6 (six) hours as needed. Patient taking differently: Take 650 mg by mouth every 6 (six) hours as needed for moderate pain.  12/09/15   de Villier, Allie Bossier II, PA    Family History History reviewed. No pertinent family history.  Social History Social History   Tobacco Use  . Smoking status: Current Every Day Smoker    Packs/day: 0.50    Types: Cigarettes  . Smokeless tobacco: Never Used  Substance Use Topics  . Alcohol use: Yes    Comment: occ  . Drug use: No     Allergies   Fish allergy and Shellfish allergy   Review of Systems Review of Systems  Genitourinary: Negative for decreased urine volume, difficulty urinating, discharge, dysuria, enuresis, flank pain, frequency, genital sores, hematuria, penile pain, penile swelling, scrotal swelling, testicular pain and urgency.     Physical Exam Triage Vital Signs ED Triage Vitals  Enc Vitals Group     BP 03/27/18 1404 (!) 128/115     Pulse Rate 03/27/18 1404 76     Resp --      Temp 03/27/18 1404 98 F (36.7 C)     Temp Source 03/27/18 1404 Oral     SpO2 03/27/18 1404 98 %     Weight --      Height --      Head  Circumference --      Peak Flow --      Pain Score 03/27/18 1402 0     Pain Loc --      Pain Edu? --      Excl. in GC? --    No data found.  Updated Vital Signs BP (!) 128/115 (BP Location: Right Arm)   Pulse 76   Temp 98 F (36.7 C) (Oral)   SpO2 98%   Visual Acuity Right Eye Distance:   Left Eye Distance:   Bilateral Distance:    Right Eye Near:   Left Eye Near:    Bilateral Near:     Physical Exam Vitals signs and nursing note reviewed.  Constitutional:      Appearance: He is well-developed.  HENT:     Head: Normocephalic and atraumatic.  Eyes:     Conjunctiva/sclera: Conjunctivae normal.  Pulmonary:     Effort: Pulmonary effort is normal.  Musculoskeletal: Normal range of motion.  Skin:    General: Skin is warm and dry.  Neurological:     Mental Status: He is alert.  Psychiatric:        Mood and Affect: Mood normal.      UC Treatments / Results  Labs (all labs ordered are listed, but only abnormal results are displayed) Labs Reviewed  URINE CYTOLOGY ANCILLARY ONLY    EKG None  Radiology No results found.  Procedures Procedures (including critical care time)  Medications Ordered in UC Medications  metroNIDAZOLE (FLAGYL) tablet 2,000 mg (2,000 mg Oral Given 03/27/18 1433)    Initial Impression / Assessment and Plan / UC Course  I have reviewed the triage vital signs and the nursing notes.  Pertinent labs & imaging results that were available during my care of the patient were reviewed by me and considered in my medical decision making (see chart for details).     Treated patient for exposure to trichomonas with Flagyl 2 g. Sending urine for cytology  Final Clinical Impressions(s) / UC Diagnoses   Final diagnoses:  STD exposure     Discharge Instructions     We treated you for trichomonas today Sending urine for testing we will call you with any positive results Follow up as needed for continued or worsening symptoms     ED  Prescriptions    None     Controlled Substance Prescriptions [place] Controlled Substance Registry consulted? Not Applicable   Janace Aris, NP 03/27/18 1444

## 2018-03-27 NOTE — ED Triage Notes (Signed)
Pt has been exposed to Trichomonas from his partner.  They are here together and it is a tense situation as she has been having symptoms for several weeks and got a positive test today.

## 2018-03-28 LAB — URINE CYTOLOGY ANCILLARY ONLY
CHLAMYDIA, DNA PROBE: NEGATIVE
NEISSERIA GONORRHEA: NEGATIVE
Trichomonas: POSITIVE — AB

## 2018-04-01 ENCOUNTER — Telehealth (HOSPITAL_COMMUNITY): Payer: Self-pay | Admitting: Emergency Medicine

## 2018-04-01 NOTE — Telephone Encounter (Signed)
Trichomonas is positive. Rx metronidazole was given at the urgent care visit. Pt needs education to please refrain from sexual intercourse for 7 days to give the medicine time to work. Sexual partners need to be notified and tested/treated. Condoms may reduce risk of reinfection. Recheck for further evaluation if symptoms are not improving.   Patient contacted and made aware of all results, all questions answered.   

## 2018-04-07 ENCOUNTER — Ambulatory Visit (HOSPITAL_COMMUNITY)
Admission: EM | Admit: 2018-04-07 | Discharge: 2018-04-07 | Disposition: A | Discharge status: Home / Self Care | Payer: BLUE CROSS/BLUE SHIELD | Attending: Family Medicine | Admitting: Family Medicine

## 2018-04-07 ENCOUNTER — Encounter (HOSPITAL_COMMUNITY): Payer: Self-pay | Admitting: Emergency Medicine

## 2018-04-07 DIAGNOSIS — Z202 Contact with and (suspected) exposure to infections with a predominantly sexual mode of transmission: Secondary | ICD-10-CM

## 2018-04-07 DIAGNOSIS — Z113 Encounter for screening for infections with a predominantly sexual mode of transmission: Secondary | ICD-10-CM

## 2018-04-07 DIAGNOSIS — A5909 Other urogenital trichomoniasis: Secondary | ICD-10-CM | POA: Diagnosis not present

## 2018-04-07 MED ORDER — METRONIDAZOLE 500 MG PO TABS
ORAL_TABLET | ORAL | Status: AC
  Filled 2018-04-07: qty 4

## 2018-04-07 MED ORDER — METRONIDAZOLE 500 MG PO TABS
2000.0000 mg | ORAL_TABLET | Freq: Once | ORAL | Status: AC
  Administered 2018-04-07: 2000 mg via ORAL

## 2018-04-07 NOTE — ED Triage Notes (Signed)
Pt sts needs treatment for trichomonas as he was treated on 2/26 but sts had sexual intercourse prior to the 7 days

## 2018-04-07 NOTE — Discharge Instructions (Signed)
We have treated you today for trichomonias, with metronidazole. Please refrain from sexual activity for 7 days while medicine is clearing infection.  Please return if symptoms not improving with treatment, development of fever, nausea, vomiting, abdominal pain, scrotal pain.

## 2018-04-08 NOTE — ED Provider Notes (Signed)
[facility]    CSN: [id number] Arrival date & time: 04/07/18  1205     History   Chief Complaint Chief Complaint  Patient presents with  . Exposure to STD    HPI Danny Carey is a 58 y.o. male history of GERD presenting today for STD treatment.  Patient was previously seen here and tested positive for trichomonas.  He was treated with metronidazole, but states that he did not wait the full 7 days to have intercourse with his wife who also has tested positive for trichomonas.  She has had continued symptoms.  He is presenting today for retreatment.  He has not had any symptoms during this.  He denies penile discharge, dysuria, increased frequency.  Denies abdominal pain, denies rashes or lesions.  He is declining further testing and requesting retreatment.  HPI  Past Medical History:  Diagnosis Date  . GERD (gastroesophageal reflux disease)     There are no active problems to display for this patient.   History reviewed. No pertinent surgical history.     Home Medications    Prior to Admission medications   Not on File    Family History History reviewed. No pertinent family history.  Social History Social History   Tobacco Use  . Smoking status: Current Every Day Smoker    Packs/day: 0.50    Types: Cigarettes  . Smokeless tobacco: Never Used  Substance Use Topics  . Alcohol use: Yes    Comment: occ  . Drug use: No     Allergies   Fish allergy and Shellfish allergy   Review of Systems Review of Systems  Constitutional: Negative for fever.  HENT: Negative for sore throat.   Respiratory: Negative for shortness of breath.   Cardiovascular: Negative for chest pain.  Gastrointestinal: Negative for abdominal pain, nausea and vomiting.  Genitourinary: Negative for difficulty urinating, discharge, dysuria, frequency, penile pain, penile swelling, scrotal swelling and testicular pain.  Skin: Negative for rash.  Neurological: Negative for  dizziness, light-headedness and headaches.     Physical Exam Triage Vital Signs ED Triage Vitals [04/07/18 1346]  Enc Vitals Group     BP 117/84     Pulse Rate 66     Resp 18     Temp (!) 97.1 F (36.2 C)     Temp Source Oral     SpO2 96 %     Weight      Height      Head Circumference      Peak Flow      Pain Score 0     Pain Loc      Pain Edu?      Excl. in GC?    No data found.  Updated Vital Signs BP 117/84 (BP Location: Left Arm)   Pulse 66   Temp (!) 97.1 F (36.2 C) (Oral)   Resp 18   SpO2 96%   Visual Acuity Right Eye Distance:   Left Eye Distance:   Bilateral Distance:    Right Eye Near:   Left Eye Near:    Bilateral Near:     Physical Exam Vitals signs and nursing note reviewed.  Constitutional:      Appearance: He is well-developed.     Comments: No acute distress  HENT:     Head: Normocephalic and atraumatic.     Nose: Nose normal.  Eyes:     Conjunctiva/sclera: Conjunctivae normal.  Neck:     Musculoskeletal: Neck supple.  Cardiovascular:  Rate and Rhythm: Normal rate.  Pulmonary:     Effort: Pulmonary effort is normal. No respiratory distress.  Abdominal:     General: There is no distension.  Musculoskeletal: Normal range of motion.  Skin:    General: Skin is warm and dry.  Neurological:     Mental Status: He is alert and oriented to person, place, and time.      UC Treatments / Results  Labs (all labs ordered are listed, but only abnormal results are displayed) Labs Reviewed - No data to display  EKG None  Radiology No results found.  Procedures Procedures (including critical care time)  Medications Ordered in UC Medications  metroNIDAZOLE (FLAGYL) tablet 2,000 mg (2,000 mg Oral Given 04/07/18 1400)    Initial Impression / Assessment and Plan / UC Course  I have reviewed the triage vital signs and the nursing notes.  Pertinent labs & imaging results that were available during my care of the patient were  reviewed by me and considered in my medical decision making (see chart for details).     Patient declined further testing of urine to check for clearance, will go ahead and retreat today with 2 g metronidazole given recent testing positive for trichomonas in the past week..  Discussed importance of holding off on intercourse for a full week as well as a full week from any partners being tested and treated.  Advised to follow-up if symptoms not resolving.Discussed strict return precautions. Patient verbalized understanding and is agreeable with plan.  Final Clinical Impressions(s) / UC Diagnoses   Final diagnoses:  Exposure to STD     Discharge Instructions     We have treated you today for trichomonias, with metronidazole. Please refrain from sexual activity for 7 days while medicine is clearing infection.  Please return if symptoms not improving with treatment, development of fever, nausea, vomiting, abdominal pain, scrotal pain.   ED Prescriptions    None     Controlled Substance Prescriptions [place] Controlled Substance Registry consulted? Not Applicable   Lew Dawes, [place] 04/08/18 [phone]

## 2020-10-07 ENCOUNTER — Emergency Department (HOSPITAL_COMMUNITY)
Admission: EM | Admit: 2020-10-07 | Discharge: 2020-10-07 | Disposition: A | Discharge status: Home / Self Care | Payer: BC Managed Care – PPO | Attending: Emergency Medicine | Admitting: Emergency Medicine

## 2020-10-07 ENCOUNTER — Encounter (HOSPITAL_COMMUNITY): Payer: Self-pay

## 2020-10-07 ENCOUNTER — Emergency Department (HOSPITAL_COMMUNITY): Payer: BC Managed Care – PPO

## 2020-10-07 ENCOUNTER — Other Ambulatory Visit: Payer: Self-pay

## 2020-10-07 DIAGNOSIS — Y92410 Unspecified street and highway as the place of occurrence of the external cause: Secondary | ICD-10-CM | POA: Insufficient documentation

## 2020-10-07 DIAGNOSIS — S60221A Contusion of right hand, initial encounter: Secondary | ICD-10-CM | POA: Insufficient documentation

## 2020-10-07 DIAGNOSIS — R519 Headache, unspecified: Secondary | ICD-10-CM | POA: Diagnosis not present

## 2020-10-07 DIAGNOSIS — F17210 Nicotine dependence, cigarettes, uncomplicated: Secondary | ICD-10-CM | POA: Diagnosis not present

## 2020-10-07 DIAGNOSIS — S39012A Strain of muscle, fascia and tendon of lower back, initial encounter: Secondary | ICD-10-CM | POA: Insufficient documentation

## 2020-10-07 DIAGNOSIS — S161XXA Strain of muscle, fascia and tendon at neck level, initial encounter: Secondary | ICD-10-CM | POA: Diagnosis not present

## 2020-10-07 DIAGNOSIS — S199XXA Unspecified injury of neck, initial encounter: Secondary | ICD-10-CM | POA: Diagnosis present

## 2020-10-07 DIAGNOSIS — M79642 Pain in left hand: Secondary | ICD-10-CM | POA: Insufficient documentation
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
## 2020-10-07 MED ORDER — KETOROLAC TROMETHAMINE 30 MG/ML IJ SOLN
30.0000 mg | Freq: Once | INTRAMUSCULAR | Status: AC
  Administered 2020-10-07: 30 mg via INTRAMUSCULAR
  Filled 2020-10-07: qty 1

## 2020-10-07 MED ORDER — LIDOCAINE 5 % EX PTCH
1.0000 | MEDICATED_PATCH | CUTANEOUS | Status: DC
  Administered 2020-10-07: 1 via TRANSDERMAL
  Filled 2020-10-07: qty 1

## 2020-10-07 MED ORDER — METHOCARBAMOL 500 MG PO TABS: 500.0000 mg | ORAL_TABLET | Freq: Two times a day (BID) | ORAL | 0 refills | Status: AC

## 2020-10-07 MED ORDER — NAPROXEN 250 MG PO TABS: 250.0000 mg | ORAL_TABLET | Freq: Two times a day (BID) | ORAL | 0 refills | Status: AC
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
## 2020-10-07 NOTE — ED Notes (Signed)
Pt ambulatory in ED lobby. 

## 2020-10-07 NOTE — ED Notes (Signed)
I have informed this pt of ED visitor policy.
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
## 2020-10-07 NOTE — ED Notes (Addendum)
Patient transported to CT 

## 2021-01-07 ENCOUNTER — Other Ambulatory Visit: Payer: Self-pay | Admitting: Physical Medicine and Rehabilitation

## 2021-01-07 ENCOUNTER — Other Ambulatory Visit (HOSPITAL_COMMUNITY): Payer: Self-pay | Admitting: Physical Medicine and Rehabilitation

## 2021-01-07 DIAGNOSIS — M542 Cervicalgia: Secondary | ICD-10-CM

## 2021-01-07 DIAGNOSIS — M545 Low back pain, unspecified: Secondary | ICD-10-CM

## 2021-01-20 ENCOUNTER — Ambulatory Visit (HOSPITAL_COMMUNITY)
Admission: RE | Admit: 2021-01-20 | Discharge: 2021-01-20 | Disposition: A | Discharge status: Home / Self Care | Payer: BC Managed Care – PPO | Source: Ambulatory Visit | Attending: Physical Medicine and Rehabilitation | Admitting: Physical Medicine and Rehabilitation

## 2021-01-20 DIAGNOSIS — M545 Low back pain, unspecified: Secondary | ICD-10-CM | POA: Insufficient documentation

## 2021-01-20 DIAGNOSIS — M542 Cervicalgia: Secondary | ICD-10-CM
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
## 2021-08-10 ENCOUNTER — Telehealth (HOSPITAL_COMMUNITY): Payer: Self-pay
